# Patient Record
Sex: Male | Born: 1960 | Race: White | Hispanic: No | Marital: Married | Smoking: Never smoker
Health system: Southern US, Community
[De-identification: ages and names within clinical notes are randomized; demographics above are authoritative.]

## PROBLEM LIST (undated history)

## (undated) DIAGNOSIS — I10 Essential (primary) hypertension: Secondary | ICD-10-CM

---

## 2015-07-03 ENCOUNTER — Emergency Department (HOSPITAL_BASED_OUTPATIENT_CLINIC_OR_DEPARTMENT_OTHER)
Admission: EM | Admit: 2015-07-03 | Discharge: 2015-07-03 | Disposition: A | Discharge status: Home / Self Care | Payer: BLUE CROSS/BLUE SHIELD | Attending: Emergency Medicine | Admitting: Emergency Medicine

## 2015-07-03 ENCOUNTER — Emergency Department (HOSPITAL_BASED_OUTPATIENT_CLINIC_OR_DEPARTMENT_OTHER): Payer: BLUE CROSS/BLUE SHIELD

## 2015-07-03 ENCOUNTER — Encounter (HOSPITAL_BASED_OUTPATIENT_CLINIC_OR_DEPARTMENT_OTHER): Payer: Self-pay | Admitting: Emergency Medicine

## 2015-07-03 DIAGNOSIS — R1030 Lower abdominal pain, unspecified: Secondary | ICD-10-CM | POA: Diagnosis present

## 2015-07-03 DIAGNOSIS — R6883 Chills (without fever): Secondary | ICD-10-CM | POA: Insufficient documentation

## 2015-07-03 DIAGNOSIS — N201 Calculus of ureter: Secondary | ICD-10-CM | POA: Diagnosis not present

## 2015-07-03 DIAGNOSIS — I10 Essential (primary) hypertension: Secondary | ICD-10-CM | POA: Diagnosis not present

## 2015-07-03 DIAGNOSIS — R110 Nausea: Secondary | ICD-10-CM | POA: Insufficient documentation

## 2015-07-03 DIAGNOSIS — R61 Generalized hyperhidrosis: Secondary | ICD-10-CM | POA: Insufficient documentation

## 2015-07-03 DIAGNOSIS — Z79899 Other long term (current) drug therapy: Secondary | ICD-10-CM | POA: Diagnosis not present

## 2015-07-03 DIAGNOSIS — R109 Unspecified abdominal pain: Secondary | ICD-10-CM

## 2015-07-03 HISTORY — DX: Essential (primary) hypertension: I10

## 2015-07-03 LAB — CBC WITH DIFFERENTIAL/PLATELET
BASOS ABS: 0 10*3/uL (ref 0.0–0.1)
BASOS PCT: 0 %
EOS ABS: 0 10*3/uL (ref 0.0–0.7)
EOS PCT: 0 %
HCT: 50.7 % (ref 39.0–52.0)
HEMOGLOBIN: 17.2 g/dL — AB (ref 13.0–17.0)
LYMPHS ABS: 1.7 10*3/uL (ref 0.7–4.0)
Lymphocytes Relative: 11 %
MCH: 30.2 pg (ref 26.0–34.0)
MCHC: 33.9 g/dL (ref 30.0–36.0)
MCV: 89.1 fL (ref 78.0–100.0)
Monocytes Absolute: 1.3 10*3/uL — ABNORMAL HIGH (ref 0.1–1.0)
Monocytes Relative: 8 %
NEUTROS PCT: 81 %
Neutro Abs: 12.8 10*3/uL — ABNORMAL HIGH (ref 1.7–7.7)
PLATELETS: 171 10*3/uL (ref 150–400)
RBC: 5.69 MIL/uL (ref 4.22–5.81)
RDW: 13 % (ref 11.5–15.5)
WBC: 15.9 10*3/uL — AB (ref 4.0–10.5)

## 2015-07-03 LAB — BASIC METABOLIC PANEL
Anion gap: 5 (ref 5–15)
BUN: 15 mg/dL (ref 6–20)
CHLORIDE: 107 mmol/L (ref 101–111)
CO2: 28 mmol/L (ref 22–32)
Calcium: 9.3 mg/dL (ref 8.9–10.3)
Creatinine, Ser: 0.96 mg/dL (ref 0.61–1.24)
Glucose, Bld: 109 mg/dL — ABNORMAL HIGH (ref 65–99)
POTASSIUM: 4.3 mmol/L (ref 3.5–5.1)
SODIUM: 140 mmol/L (ref 135–145)

## 2015-07-03 LAB — URINALYSIS, ROUTINE W REFLEX MICROSCOPIC
Bilirubin Urine: NEGATIVE
GLUCOSE, UA: NEGATIVE mg/dL
Hgb urine dipstick: NEGATIVE
KETONES UR: NEGATIVE mg/dL
Nitrite: NEGATIVE
PH: 7.5 (ref 5.0–8.0)
Protein, ur: NEGATIVE mg/dL
SPECIFIC GRAVITY, URINE: 1.022 (ref 1.005–1.030)
Urobilinogen, UA: 1 mg/dL (ref 0.0–1.0)

## 2015-07-03 LAB — URINE MICROSCOPIC-ADD ON

## 2015-07-03 MED ORDER — ONDANSETRON 4 MG PO TBDP
4.0000 mg | ORAL_TABLET | Freq: Three times a day (TID) | ORAL | Status: AC | PRN
Start: 2015-07-03 — End: ?

## 2015-07-03 MED ORDER — ONDANSETRON HCL 4 MG/2ML IJ SOLN
4.0000 mg | Freq: Once | INTRAMUSCULAR | Status: AC
  Administered 2015-07-03: 4 mg via INTRAVENOUS
  Filled 2015-07-03: qty 2

## 2015-07-03 MED ORDER — NAPROXEN 500 MG PO TABS: 500.0000 mg | ORAL_TABLET | Freq: Two times a day (BID) | ORAL | Status: AC

## 2015-07-03 MED ORDER — SODIUM CHLORIDE 0.9 % IV SOLN: INTRAVENOUS | Status: DC

## 2015-07-03 MED ORDER — HYDROCODONE-ACETAMINOPHEN 5-325 MG PO TABS: 1.0000 | ORAL_TABLET | Freq: Four times a day (QID) | ORAL | Status: AC | PRN

## 2015-07-03 MED ORDER — HYDROMORPHONE HCL 1 MG/ML IJ SOLN
1.0000 mg | Freq: Once | INTRAMUSCULAR | Status: AC
  Administered 2015-07-03: 1 mg via INTRAVENOUS
  Filled 2015-07-03: qty 1

## 2015-07-03 MED ORDER — SODIUM CHLORIDE 0.9 % IV BOLUS (SEPSIS)
500.0000 mL | Freq: Once | INTRAVENOUS | Status: AC
  Administered 2015-07-03: 500 mL via INTRAVENOUS

## 2015-07-03 NOTE — ED Provider Notes (Signed)
CSN: [id number]     Arrival date & time 07/03/15  1324 History  By signing my name below, I, Ronney Lion, attest that this documentation has been prepared under the direction and in the presence of Vanetta Mulders, MD. Electronically Signed: Ronney Lion, ED Scribe. 07/03/2015. 4:01 PM.      Chief Complaint  Patient presents with  . Groin Pain   Patient is a 54 y.o. male presenting with groin pain. The history is provided by the patient. No language interpreter was used.  Groin Pain This is a new problem. The current episode started more than 1 week ago. Episode frequency: constant, resolved for a period of time, returned. Pertinent negatives include no chest pain, no abdominal pain and no headaches. Nothing aggravates the symptoms. Nothing relieves the symptoms. He has tried nothing for the symptoms.   HPI Comments: Steven Bruce is a 54 y.o. male with a history of HTN, who presents to the Emergency Department complaining of intermittent, 7/10, aching, stabbing right groin pain that began 2 weeks ago and acutely worsened 6 days ago. He reports the pain resolved the same day that it worsened 6 days ago, but it returned yesterday. He notes associated nausea, subjective fever and chills, diaphoresis, and urinary urgency during the episode of pain 6 days ago. He also endorses occasional back pain. He denies a personal history of any prior kidney stones but reports a family history of kidney stones. He denies visual changes, cough, rhinorrhea, sore throat, chest pain, SOB, vomiting, diarrhea, dysuria, hematuria, leg swelling, bleeding easily, rash, or headache.   Past Medical History  Diagnosis Date  . Hypertension    History reviewed. No pertinent past surgical history. History reviewed. No pertinent family history. Social History  Substance Use Topics  . Smoking status: Never Smoker   . Smokeless tobacco: Current User    Types: Chew  . Alcohol Use: No    Review of Systems  Constitutional:  Positive for fever (subjective), chills and diaphoresis.  HENT: Negative for rhinorrhea and sore throat.   Eyes: Negative for visual disturbance.  Respiratory: Negative for cough.   Cardiovascular: Negative for chest pain and leg swelling.  Gastrointestinal: Positive for nausea. Negative for vomiting, abdominal pain and diarrhea.  Genitourinary: Positive for urgency. Negative for dysuria and hematuria.  Musculoskeletal: Positive for back pain. Negative for myalgias.  Skin: Negative for rash.  Neurological: Negative for headaches.  Hematological: Does not bruise/bleed easily.    Allergies  Review of patient's allergies indicates no known allergies.  Home Medications   Prior to Admission medications   Medication Sig Start Date End Date Taking? Authorizing Provider  lisinopril (PRINIVIL,ZESTRIL) 10 MG tablet Take 10 mg by mouth daily.   Yes Historical Provider, MD  HYDROcodone-acetaminophen (NORCO/VICODIN) 5-325 MG tablet Take 1-2 tablets by mouth every 6 (six) hours as needed for moderate pain. 07/03/15   Vanetta Mulders, MD  naproxen (NAPROSYN) 500 MG tablet Take 1 tablet (500 mg total) by mouth 2 (two) times daily. 07/03/15   Vanetta Mulders, MD  ondansetron (ZOFRAN ODT) 4 MG disintegrating tablet Take 1 tablet (4 mg total) by mouth every 8 (eight) hours as needed for nausea or vomiting. 07/03/15   Vanetta Mulders, MD   BP 110/64 mmHg  Pulse 68  Temp(Src) 98.1 F (36.7 C) (Oral)  Resp 16  Ht  (1.753 m)  Wt 220 lb (99.791 kg)  BMI 32.47 kg/m2  SpO2 98% Physical Exam  Constitutional: He is oriented to person, place, and time.  He appears well-developed and well-nourished. No distress.  HENT:  Head: Normocephalic and atraumatic.  Mouth/Throat: Oropharynx is clear and moist.  Mucous membranes are moist.  Eyes: Conjunctivae and EOM are normal. Pupils are equal, round, and reactive to light. No scleral icterus.  Pupils appear normal. Sclera is clear. Eyes track normally.   Neck: Neck supple. No tracheal deviation present.  Cardiovascular: Normal rate and regular rhythm.   No murmur heard. Pulmonary/Chest: Effort normal and breath sounds normal. No respiratory distress. He has no wheezes. He has no rales.  Lungs are clear to auscultation bilaterally.  Abdominal: Soft. Bowel sounds are normal. There is no tenderness.  Abdomen is non-tender.  Musculoskeletal: Normal range of motion. He exhibits no edema.  No BLE swelling.  Neurological: He is alert and oriented to person, place, and time.  Skin: Skin is warm and dry.  Psychiatric: He has a normal mood and affect. His behavior is normal.  Nursing note and vitals reviewed.   ED Course  Procedures (including critical care time)  DIAGNOSTIC STUDIES: Oxygen Saturation is 99% on RA, normal by my interpretation.    COORDINATION OF CARE: 3:27 PM - Discussed treatment plan with pt at bedside which includes imaging. Pt verbalized understanding and agreed to plan.   Labs Review Labs Reviewed  URINALYSIS, ROUTINE W REFLEX MICROSCOPIC (NOT AT [facility]) - Abnormal; Notable for the following:    APPearance TURBID (*)    Leukocytes, UA SMALL (*)    All other components within normal limits  URINE MICROSCOPIC-ADD ON - Abnormal; Notable for the following:    Bacteria, UA FEW (*)    All other components within normal limits  CBC WITH DIFFERENTIAL/PLATELET - Abnormal; Notable for the following:    WBC 15.9 (*)    Hemoglobin 17.2 (*)    Neutro Abs 12.8 (*)    Monocytes Absolute 1.3 (*)    All other components within normal limits  BASIC METABOLIC PANEL - Abnormal; Notable for the following:    Glucose, Bld 109 (*)    All other components within normal limits   Results for orders placed or performed during the hospital encounter of 07/03/15  Urinalysis, Routine w reflex microscopic (not at [facility])  Result Value Ref Range   Color, Urine YELLOW YELLOW   APPearance TURBID (A) CLEAR   Specific Gravity, Urine 1.022 1.005  - 1.030   pH 7.5 5.0 - 8.0   Glucose, UA NEGATIVE NEGATIVE mg/dL   Hgb urine dipstick NEGATIVE NEGATIVE   Bilirubin Urine NEGATIVE NEGATIVE   Ketones, ur NEGATIVE NEGATIVE mg/dL   Protein, ur NEGATIVE NEGATIVE mg/dL   Urobilinogen, UA 1.0 0.0 - 1.0 mg/dL   Nitrite NEGATIVE NEGATIVE   Leukocytes, UA SMALL (A) NEGATIVE  Urine microscopic-add on  Result Value Ref Range   Squamous Epithelial / LPF RARE RARE   WBC, UA 7-10 <3 WBC/hpf   RBC / HPF 0-2 <3 RBC/hpf   Bacteria, UA FEW (A) RARE   Urine-Other AMORPHOUS URATES/PHOSPHATES   CBC with Differential/Platelet  Result Value Ref Range   WBC 15.9 (H) 4.0 - 10.5 K/uL   RBC 5.69 4.22 - 5.81 MIL/uL   Hemoglobin 17.2 (H) 13.0 - 17.0 g/dL   HCT 82.950.7 56.239.0 - 13.052.0 %   MCV 89.1 78.0 - 100.0 fL   MCH 30.2 26.0 - 34.0 pg   MCHC 33.9 30.0 - 36.0 g/dL   RDW 86.513.0 78.411.5 - 69.615.5 %   Platelets 171 150 - 400 K/uL   Neutrophils Relative %  81 %   Neutro Abs 12.8 (H) 1.7 - 7.7 K/uL   Lymphocytes Relative 11 %   Lymphs Abs 1.7 0.7 - 4.0 K/uL   Monocytes Relative 8 %   Monocytes Absolute 1.3 (H) 0.1 - 1.0 K/uL   Eosinophils Relative 0 %   Eosinophils Absolute 0.0 0.0 - 0.7 K/uL   Basophils Relative 0 %   Basophils Absolute 0.0 0.0 - 0.1 K/uL  Basic metabolic panel  Result Value Ref Range   Sodium 140 135 - 145 mmol/L   Potassium 4.3 3.5 - 5.1 mmol/L   Chloride 107 101 - 111 mmol/L   CO2 28 22 - 32 mmol/L   Glucose, Bld 109 (H) 65 - 99 mg/dL   BUN 15 6 - 20 mg/dL   Creatinine, Ser 1.61 0.61 - 1.24 mg/dL   Calcium 9.3 8.9 - 09.6 mg/dL   GFR calc non Af Amer >60 >60 mL/min   GFR calc Af Amer >60 >60 mL/min   Anion gap 5 5 - 15     Imaging Review Ct Renal Stone Study  07/03/2015  CLINICAL DATA:  Right flank pain for 5 days. EXAM: CT ABDOMEN AND PELVIS WITHOUT CONTRAST TECHNIQUE: Multidetector CT imaging of the abdomen and pelvis was performed following the standard protocol without IV contrast. COMPARISON:  None. 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 Visualized lung bases appear normal. No gallstones are noted. Mildly lobular hepatic contours are noted suggesting possible hepatic cirrhosis. No focal abnormality is noted in the spleen or pancreas on these unenhanced images. Adrenal glands appear normal. 9 mm nonobstructive calculus is noted in upper pole collecting system of left kidney. Smaller nonobstructive calculus is noted in lower pole collecting system. Mild right hydroureteronephrosis with perinephric stranding is noted secondary to 4 mm calculus at right ureterovesical junction. There is no evidence of bowel obstruction. The appendix appears normal. No abnormal fluid collection is noted. Sigmoid diverticulosis is noted without inflammation. Mild bilateral fat containing inguinal hernias are noted. No significant adenopathy is noted. IMPRESSION: Mildly lobular hepatic contours are noted suggesting possible hepatic cirrhosis. Nonobstructive left nephrolithiasis. Mild right hydroureteronephrosis with perinephric stranding is noted secondary to 4 mm right ureterovesical junction calculus. Electronically Signed   By: Lupita Raider, M.D.   On: 07/03/2015 17:43   I have personally reviewed and evaluated these images and lab results as part of my medical decision-making.  MDM   Final diagnoses:  Flank pain  Right ureteral stone   Patients history clinically concerning for right-sided kidney stone. Confirmed by CT scan showing 4 mm distal ureter stone. Normal renal function. Patient will be treated symptomatically and referral to urology for follow-up. No complicating factors.  I, Glennys Schorsch, personally performed the services described in this documentation. All medical record entries made by the scribe were at my direction and in my presence.  I have reviewed the chart and discharge instructions and agree that the record reflects my personal performance and is accurate and complete.  Ezri Landers.  07/03/2015. 6:04 PM.       Vanetta Mulders, MD 07/03/15 0454

## 2015-07-03 NOTE — ED Notes (Signed)
Pt reports waxing and waning right flank and groin pain, states pain started last Sunday to right lower abdomen, pain went away and returned this am with pain radiating to right groin

## 2015-07-03 NOTE — ED Notes (Signed)
He denies n/v

## 2015-07-03 NOTE — ED Notes (Signed)
Patient states pain started this morning and progressive.  He states pain was present last Sunday, but resolved.  He has no history of kidney stones..  He denies urine bleeding or odor.

## 2015-07-03 NOTE — ED Notes (Signed)
Patient AAO X 3 and stable.  Patient and spouse verbalizes understanding discharge follow-up and medications.

## 2015-07-03 NOTE — Discharge Instructions (Signed)
Dietary Guidelines to Help Prevent Kidney Stones Your risk of kidney stones can be decreased by adjusting the foods you eat. The most important thing you can do is drink enough fluid. You should drink enough fluid to keep your urine clear or pale yellow. The following guidelines provide specific information for the type of kidney stone you have had. GUIDELINES ACCORDING TO TYPE OF KIDNEY STONE Calcium Oxalate Kidney Stones  Reduce the amount of salt you eat. Foods that have a lot of salt cause your body to release excess calcium into your urine. The excess calcium can combine with a substance called oxalate to form kidney stones.  Reduce the amount of animal protein you eat if the amount you eat is excessive. Animal protein causes your body to release excess calcium into your urine. Ask your dietitian how much protein from animal sources you should be eating.  Avoid foods that are high in oxalates. If you take vitamins, they should have less than 500 mg of vitamin C. Your body turns vitamin C into oxalates. You do not need to avoid fruits and vegetables high in vitamin C. Calcium Phosphate Kidney Stones  Reduce the amount of salt you eat to help prevent the release of excess calcium into your urine.  Reduce the amount of animal protein you eat if the amount you eat is excessive. Animal protein causes your body to release excess calcium into your urine. Ask your dietitian how much protein from animal sources you should be eating.  Get enough calcium from food or take a calcium supplement (ask your dietitian for recommendations). Food sources of calcium that do not increase your risk of kidney stones include:  Broccoli.  Dairy products, such as cheese and yogurt.  Pudding. Uric Acid Kidney Stones  Do not have more than 6 oz of animal protein per day. FOOD SOURCES Animal Protein Sources  Meat (all types).  Poultry.  Eggs.  Fish, seafood. Foods High in MirantSalt  Salt seasonings.  Soy  sauce.  Teriyaki sauce.  Cured and processed meats.  Salted crackers and snack foods.  Fast food.  Canned soups and most canned foods. Foods High in Oxalates  Grains:  Amaranth.  Barley.  Grits.  Wheat germ.  Bran.  Buckwheat flour.  All bran cereals.  Pretzels.  Whole wheat bread.  Vegetables:  Beans (wax).  Beets and beet greens.  Collard greens.  Eggplant.  Escarole.  Leeks.  Okra.  Parsley.  Rutabagas.  Spinach.  Swiss chard.  Tomato paste.  Fried potatoes.  Sweet potatoes.  Fruits:  Red currants.  Figs.  Kiwi.  Rhubarb.  Meat and Other Protein Sources:  Beans (dried).  Soy burgers and other soybean products.  Miso.  Nuts (peanuts, almonds, pecans, cashews, hazelnuts).  Nut butters.  Sesame seeds and tahini (paste made of sesame seeds).  Poppy seeds.  Beverages:  Chocolate drink mixes.  Soy milk.  Instant iced tea.  Juices made from high-oxalate fruits or vegetables.  Other:  Carob.  Chocolate.  Fruitcake.  Marmalades.   This information is not intended to replace advice given to you by your health care provider. Make sure you discuss any questions you have with your health care provider.   Take the Naprosyn on a regular basis. Take Zofran as needed for nausea and vomiting. Take the hydrocodone as needed for additional pain control. Call urology for follow-up on Monday. Return for any new or worse symptoms.   Document Released: 12/23/2010 Document Revised: 09/02/2013 Document Reviewed: 07/25/2013  Elsevier Interactive Patient Education ©2016 [employer] ° °

## 2016-06-02 IMAGING — CT CT RENAL STONE PROTOCOL
2 of 4 series · 17 of 46 positions shown, 19 images · non-contrast
Comparison: None.

CLINICAL DATA: Right flank pain for 5 days.

EXAM:
CT ABDOMEN AND PELVIS WITHOUT CONTRAST
TECHNIQUE: Multidetector CT imaging of the abdomen and pelvis was performed
following the standard protocol without IV contrast.

[Series 2: renal stone > 200 lbs 5.0 b31f · axial · 0.84mm/px · z∈[-493,-28]mm · 14 of 103 slices shown, 16 images]
[im 5/103  soft-tissue]
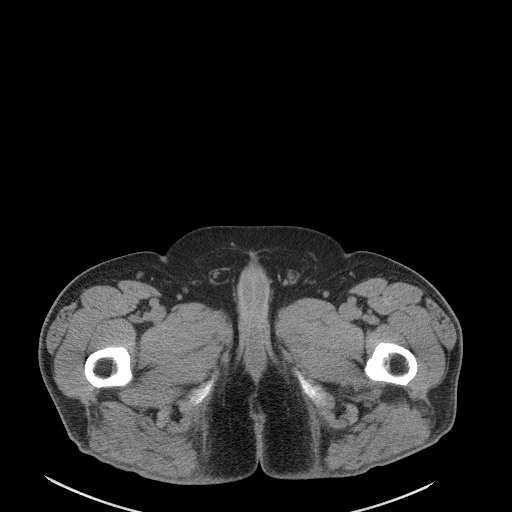
[im 5/103  bone]
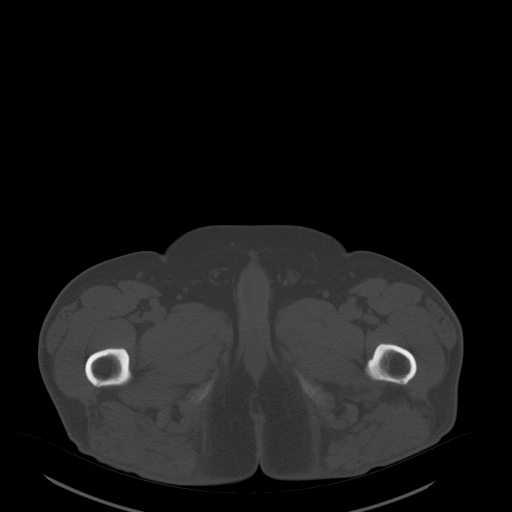
[im 13/103  soft-tissue]
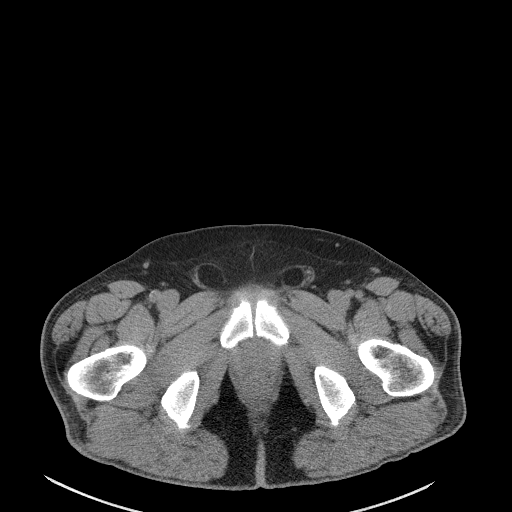
[im 22/103  soft-tissue]
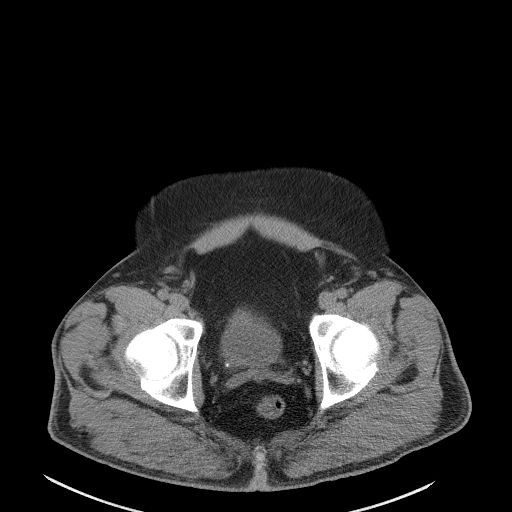
[im 26/103  soft-tissue]
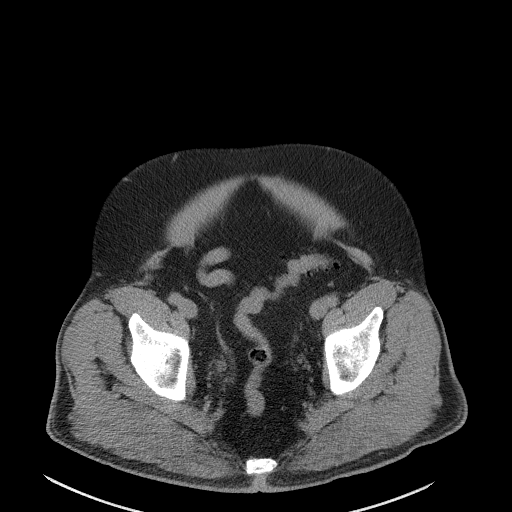
[im 35/103  soft-tissue]
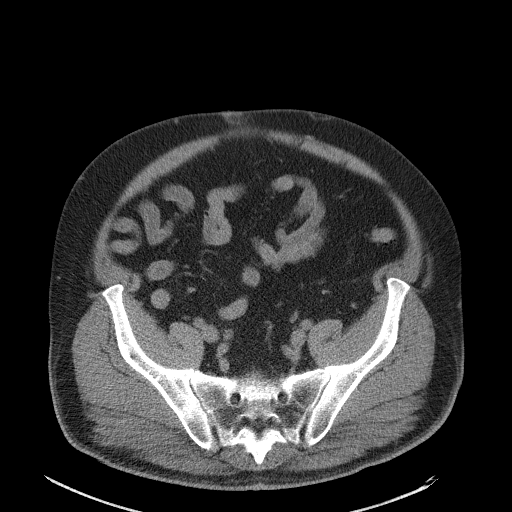
[im 43/103  soft-tissue]
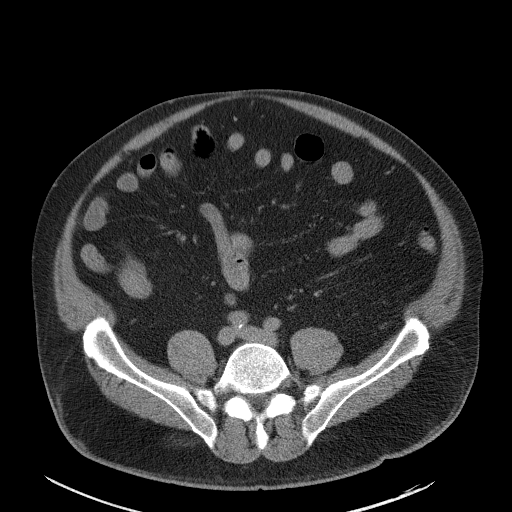
[im 47/103  soft-tissue]
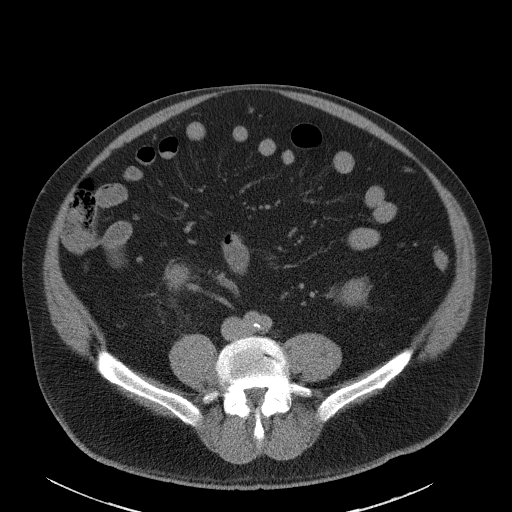
[im 56/103  soft-tissue]
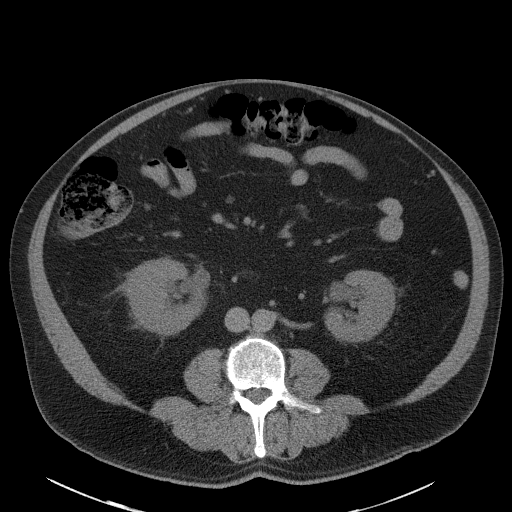
[im 60/103  soft-tissue]
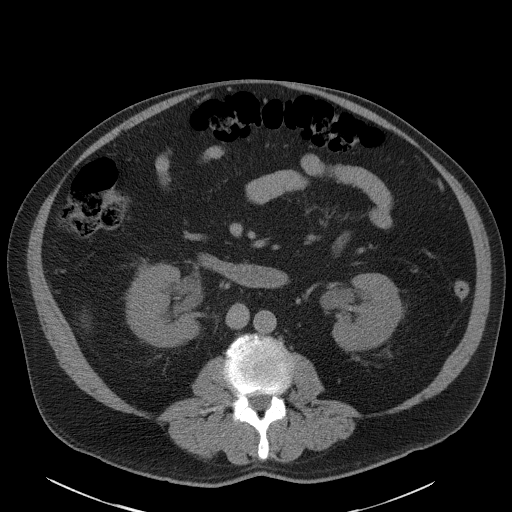
[im 60/103  bone]
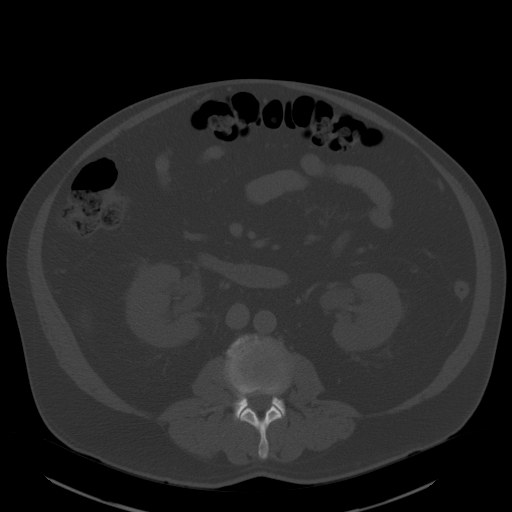
[im 69/103  soft-tissue]
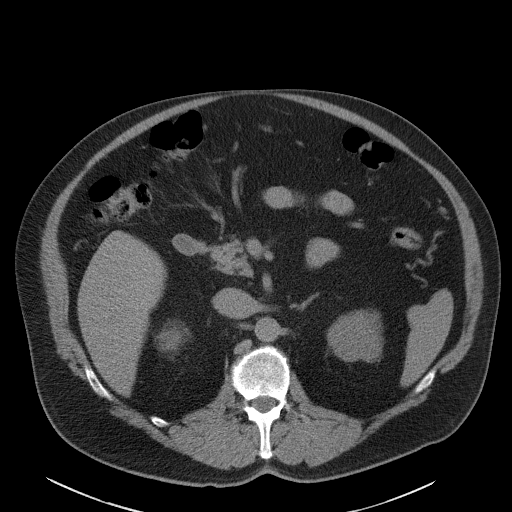
[im 77/103  soft-tissue]
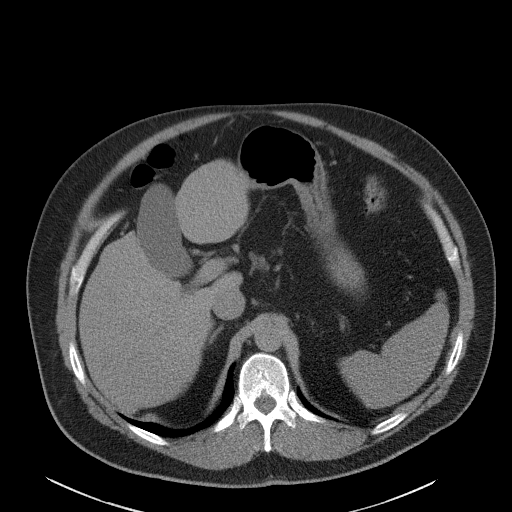
[im 81/103  soft-tissue]
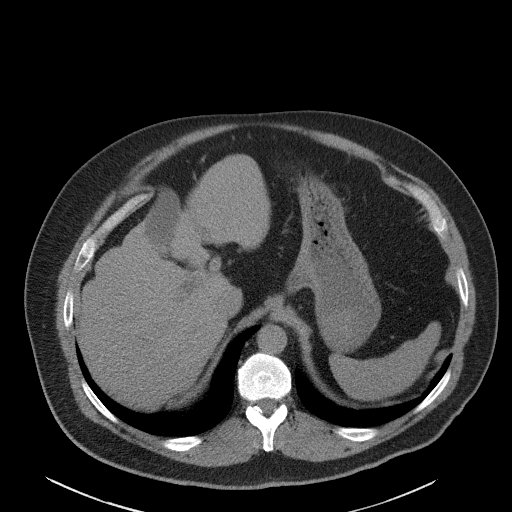
[im 90/103  soft-tissue]
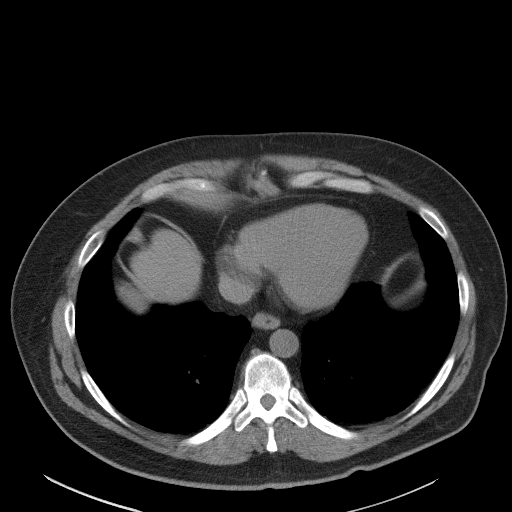
[im 98/103  soft-tissue]
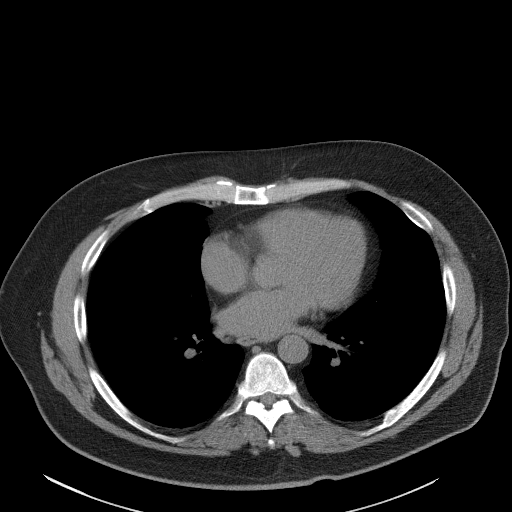

[Series 5: renal stone 3.0 coronal · coronal · 1.22mm/px · 3 of 115 slices shown]
[im 39/115  soft-tissue]
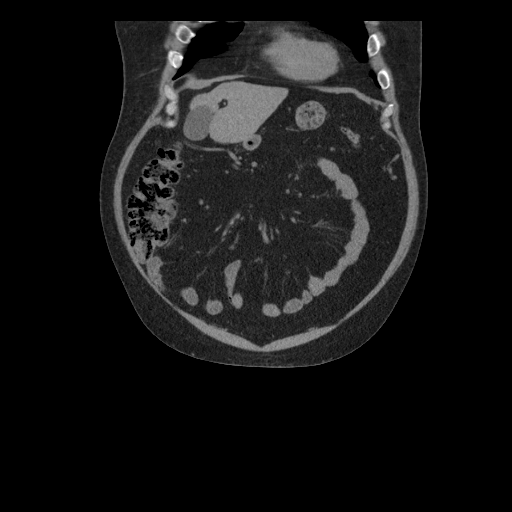
[im 51/115  soft-tissue]
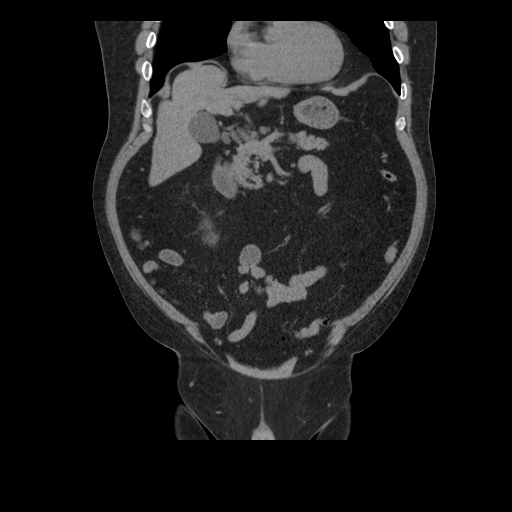
[im 64/115  soft-tissue]
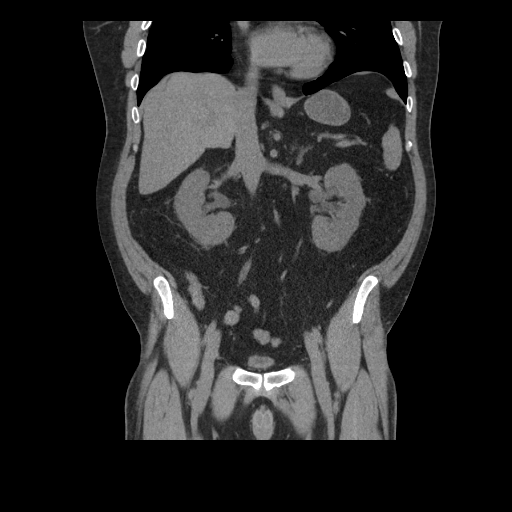

[17 of 46 positions shown; findings below may reference images not displayed]

FINDINGS: Multilevel degenerative disc disease is noted in the lumbar spine.
Visualized lung bases appear normal.

No gallstones are noted. Mildly lobular hepatic contours are noted
suggesting possible hepatic cirrhosis. No focal abnormality is noted
in the spleen or pancreas on these unenhanced images. Adrenal glands
appear normal. 9 mm nonobstructive calculus is noted in upper pole
collecting system of left kidney. Smaller nonobstructive calculus is
noted in lower pole collecting system. Mild right
hydroureteronephrosis with perinephric stranding is noted secondary
to 4 mm calculus at right ureterovesical junction. There is no
evidence of bowel obstruction. The appendix appears normal. No
abnormal fluid collection is noted. Sigmoid diverticulosis is noted
without inflammation. Mild bilateral fat containing inguinal hernias
are noted. No significant adenopathy is noted.
IMPRESSION: Mildly lobular hepatic contours are noted suggesting possible
hepatic cirrhosis.

Nonobstructive left nephrolithiasis.

Mild right hydroureteronephrosis with perinephric stranding is noted
secondary to 4 mm right ureterovesical junction calculus.
